# Patient Record
Sex: Male | Born: 1965 | Race: White | Hispanic: No | Marital: Married
Health system: Southern US, Community
[De-identification: ages and names within clinical notes are randomized; demographics above are authoritative.]

## PROBLEM LIST (undated history)

## (undated) DIAGNOSIS — M549 Dorsalgia, unspecified: Secondary | ICD-10-CM

## (undated) DIAGNOSIS — B1920 Unspecified viral hepatitis C without hepatic coma: Secondary | ICD-10-CM

## (undated) DIAGNOSIS — F32A Depression, unspecified: Secondary | ICD-10-CM

## (undated) DIAGNOSIS — K7460 Unspecified cirrhosis of liver: Secondary | ICD-10-CM

## (undated) DIAGNOSIS — F329 Major depressive disorder, single episode, unspecified: Secondary | ICD-10-CM

## (undated) DIAGNOSIS — F1910 Other psychoactive substance abuse, uncomplicated: Secondary | ICD-10-CM

## (undated) DIAGNOSIS — K5792 Diverticulitis of intestine, part unspecified, without perforation or abscess without bleeding: Secondary | ICD-10-CM

---

## 2017-08-27 ENCOUNTER — Emergency Department (HOSPITAL_COMMUNITY): Payer: Self-pay

## 2017-08-27 ENCOUNTER — Encounter (HOSPITAL_COMMUNITY): Payer: Self-pay

## 2017-08-27 ENCOUNTER — Other Ambulatory Visit: Payer: Self-pay

## 2017-08-27 ENCOUNTER — Emergency Department (HOSPITAL_COMMUNITY)
Admission: EM | Admit: 2017-08-27 | Discharge: 2017-08-28 | Disposition: A | Discharge status: Home / Self Care | Payer: Self-pay | Attending: Emergency Medicine | Admitting: Emergency Medicine

## 2017-08-27 DIAGNOSIS — F331 Major depressive disorder, recurrent, moderate: Secondary | ICD-10-CM | POA: Insufficient documentation

## 2017-08-27 DIAGNOSIS — F1994 Other psychoactive substance use, unspecified with psychoactive substance-induced mood disorder: Secondary | ICD-10-CM | POA: Diagnosis present

## 2017-08-27 DIAGNOSIS — R1084 Generalized abdominal pain: Secondary | ICD-10-CM | POA: Insufficient documentation

## 2017-08-27 DIAGNOSIS — R45851 Suicidal ideations: Secondary | ICD-10-CM

## 2017-08-27 DIAGNOSIS — Z79899 Other long term (current) drug therapy: Secondary | ICD-10-CM | POA: Insufficient documentation

## 2017-08-27 DIAGNOSIS — F1410 Cocaine abuse, uncomplicated: Secondary | ICD-10-CM | POA: Insufficient documentation

## 2017-08-27 HISTORY — DX: Unspecified viral hepatitis C without hepatic coma: B19.20

## 2017-08-27 HISTORY — DX: Diverticulitis of intestine, part unspecified, without perforation or abscess without bleeding: K57.92

## 2017-08-27 HISTORY — DX: Depression, unspecified: F32.A

## 2017-08-27 HISTORY — DX: Major depressive disorder, single episode, unspecified: F32.9

## 2017-08-27 HISTORY — DX: Unspecified cirrhosis of liver: K74.60

## 2017-08-27 HISTORY — DX: Dorsalgia, unspecified: M54.9

## 2017-08-27 HISTORY — DX: Other psychoactive substance abuse, uncomplicated: F19.10

## 2017-08-27 LAB — URINALYSIS, ROUTINE W REFLEX MICROSCOPIC
BILIRUBIN URINE: NEGATIVE
Bacteria, UA: NONE SEEN
Glucose, UA: NEGATIVE mg/dL
Hgb urine dipstick: NEGATIVE
Ketones, ur: NEGATIVE mg/dL
Leukocytes, UA: NEGATIVE
Nitrite: NEGATIVE
PROTEIN: 100 mg/dL — AB
SPECIFIC GRAVITY, URINE: 1.026 (ref 1.005–1.030)
pH: 5 (ref 5.0–8.0)

## 2017-08-27 LAB — RAPID URINE DRUG SCREEN, HOSP PERFORMED
Amphetamines: NOT DETECTED
Barbiturates: NOT DETECTED
Benzodiazepines: NOT DETECTED
Cocaine: POSITIVE — AB
OPIATES: NOT DETECTED
TETRAHYDROCANNABINOL: NOT DETECTED

## 2017-08-27 LAB — COMPREHENSIVE METABOLIC PANEL
ALBUMIN: 3.7 g/dL (ref 3.5–5.0)
ALK PHOS: 64 U/L (ref 38–126)
ALT: 16 U/L — AB (ref 17–63)
AST: 15 U/L (ref 15–41)
Anion gap: 11 (ref 5–15)
BUN: 17 mg/dL (ref 6–20)
CALCIUM: 8.8 mg/dL — AB (ref 8.9–10.3)
CO2: 15 mmol/L — AB (ref 22–32)
CREATININE: 1.14 mg/dL (ref 0.61–1.24)
Chloride: 109 mmol/L (ref 101–111)
GFR calc Af Amer: >60 mL/min (ref >60)
GFR calc non Af Amer: >60 mL/min (ref >60)
GLUCOSE: 101 mg/dL — AB (ref 65–99)
Potassium: 3.4 mmol/L — ABNORMAL LOW (ref 3.5–5.1)
SODIUM: 135 mmol/L (ref 135–145)
Total Bilirubin: 0.6 mg/dL (ref 0.3–1.2)
Total Protein: 7.2 g/dL (ref 6.5–8.1)

## 2017-08-27 LAB — CBC WITH DIFFERENTIAL/PLATELET
Basophils Absolute: 0 10*3/uL (ref 0.0–0.1)
Basophils Relative: 0 %
Eosinophils Absolute: 0 10*3/uL (ref 0.0–0.7)
Eosinophils Relative: 0 %
HEMATOCRIT: 41 % (ref 39.0–52.0)
HEMOGLOBIN: 14.2 g/dL (ref 13.0–17.0)
LYMPHS ABS: 1.6 10*3/uL (ref 0.7–4.0)
LYMPHS PCT: 10 %
MCH: 31 pg (ref 26.0–34.0)
MCHC: 34.6 g/dL (ref 30.0–36.0)
MCV: 89.5 fL (ref 78.0–100.0)
Monocytes Absolute: 1.4 10*3/uL — ABNORMAL HIGH (ref 0.1–1.0)
Monocytes Relative: 9 %
NEUTROS PCT: 81 %
Neutro Abs: 13.3 10*3/uL — ABNORMAL HIGH (ref 1.7–7.7)
Platelets: 188 10*3/uL (ref 150–400)
RBC: 4.58 MIL/uL (ref 4.22–5.81)
RDW: 13.2 % (ref 11.5–15.5)
WBC: 16.3 10*3/uL — AB (ref 4.0–10.5)

## 2017-08-27 LAB — LIPID PANEL
Cholesterol: 201 mg/dL — ABNORMAL HIGH (ref 0–200)
HDL: 44 mg/dL (ref >40)
LDL CALC: 124 mg/dL — AB (ref 0–99)
TRIGLYCERIDES: 164 mg/dL — AB (ref <150)
Total CHOL/HDL Ratio: 4.6 RATIO
VLDL: 33 mg/dL (ref 0–40)

## 2017-08-27 LAB — PROTIME-INR
INR: 1.02
Prothrombin Time: 13.3 seconds (ref 11.4–15.2)

## 2017-08-27 LAB — I-STAT TROPONIN, ED: Troponin i, poc: 0.01 ng/mL (ref 0.00–0.08)

## 2017-08-27 LAB — ACETAMINOPHEN LEVEL: Acetaminophen (Tylenol), Serum: <10 ug/mL — ABNORMAL LOW (ref 10–30)

## 2017-08-27 LAB — TROPONIN I: Troponin I: <0.03 ng/mL (ref <0.03)

## 2017-08-27 LAB — APTT: aPTT: 28 seconds (ref 24–36)

## 2017-08-27 LAB — SALICYLATE LEVEL

## 2017-08-27 LAB — ETHANOL

## 2017-08-27 MED ORDER — IOPAMIDOL (ISOVUE-300) INJECTION 61%
INTRAVENOUS | Status: AC
  Administered 2017-08-27: 100 mL
  Filled 2017-08-27: qty 100

## 2017-08-27 MED ORDER — SODIUM CHLORIDE 0.9 % IV SOLN
INTRAVENOUS | Status: DC
  Administered 2017-08-27: 16:00:00 via INTRAVENOUS

## 2017-08-27 MED ORDER — ACETAMINOPHEN 325 MG PO TABS
650.0000 mg | ORAL_TABLET | Freq: Once | ORAL | Status: AC
  Administered 2017-08-27: 650 mg via ORAL
  Filled 2017-08-27: qty 2

## 2017-08-27 MED ORDER — NITROGLYCERIN 0.4 MG SL SUBL
0.4000 mg | SUBLINGUAL_TABLET | SUBLINGUAL | Status: DC | PRN
  Administered 2017-08-27: 0.4 mg via SUBLINGUAL
  Filled 2017-08-27: qty 1

## 2017-08-27 NOTE — ED Provider Notes (Signed)
[facility] EMERGENCY DEPARTMENT Provider Note   CSN: [id number] Arrival date & time: 08/27/17  1507     History   Chief Complaint Chief Complaint  Patient presents with  . Suicidal  . Chest Pain    HPI Jose Jennings is a 52 y.o. male presenting for evaluation of chest pain.  Patient states that his chest pain began approximately 30 minutes ago.  It began after he used cocaine.  He states pain is described as a pressure right in the center of his chest.  He has associated nausea and shortness of breath.  He was given aspirin, nitro, and Zofran in route without improvement of symptoms.  He reports fevers, diarrhea, and cramping abdominal pain which began on Friday.  Has history of diverticulitis requiring surgery 2 years ago.  He reports nonproductive cough.  Denies abnormal urination.  No leg pain or swelling.  Smokes daily.  Denies alcohol or other drug use.  Family history of ACS.  No personal history of ACS.  History of high blood pressure for which she does not take medication, no history of diabetes or hyperlipidemia. Patient states he has been having suicidal thoughts.  He used cocaine with the intent to kill himself.  He is currently having suicidal thoughts without a plan.  No homicidal thoughts.  He states he is hearing and seeing dark shapes.  HPI  History reviewed. No pertinent past medical history.  There are no active problems to display for this patient.   History reviewed. No pertinent surgical history.      Home Medications    Prior to Admission medications   Medication Sig Start Date End Date Taking? Authorizing Provider  acetaminophen (TYLENOL) 500 MG tablet Take 500 mg by mouth every 6 (six) hours as needed for headache (pain).   Yes [provider]  DULoxetine (CYMBALTA) 60 MG capsule Take 60 mg by mouth daily.   Yes [provider]  QUEtiapine (SEROQUEL) 200 MG tablet Take 200 mg by mouth at bedtime.   Yes [provider]    Family History No family history on file.  Social History Social History   Tobacco Use  . Smoking status: Not on file  Substance Use Topics  . Alcohol use: Not on file  . Drug use: Not on file     Allergies   Patient has no known allergies.   Review of Systems Review of Systems  Respiratory: Positive for cough, chest tightness and shortness of breath.   Cardiovascular: Positive for chest pain.  Gastrointestinal: Positive for abdominal pain, diarrhea and nausea. Negative for blood in stool and vomiting.  Psychiatric/Behavioral: Positive for dysphoric mood, hallucinations, self-injury and suicidal ideas.  All other systems reviewed and are negative.    Physical Exam Updated Vital Signs BP (!) 116/59   Pulse 89   Temp 98.7 F (37.1 C) (Oral)   Resp (!) 24   SpO2 100%   Physical Exam  Constitutional: He is oriented to person, place, and time. He appears well-developed and well-nourished.  Pt appears uncomfortable due to pain.   HENT:  Head: Normocephalic and atraumatic.  Eyes: Pupils are equal, round, and reactive to light.  Neck: Normal range of motion. Neck supple.  Cardiovascular: Normal rate, regular rhythm and intact distal pulses.  Pulmonary/Chest: Effort normal and breath sounds normal. No respiratory distress. He has no wheezes.  Speaking in full sentences. Clear lung sounds  Abdominal: Soft. He exhibits no distension and no mass. There is  tenderness. There is no guarding.  Generalized TTP of the abd, worse in LLQ. No rigidity, guarding, or distention.  Musculoskeletal: Normal range of motion.  No leg pain or swelling. Radial and pedal pulses intact bilaterally.   Neurological: He is alert and oriented to person, place, and time. No sensory deficit.  Skin: Skin is warm and dry.  Psychiatric: He is hyperactive and actively hallucinating. He expresses suicidal ideation. He expresses no homicidal ideation. He expresses suicidal plans. He  expresses no homicidal plans.  Active responding to internal stimuli. Use of cocaine in attempt to harm himself. reports current SI. Behavior mildly hyperactive  Nursing note and vitals reviewed.    ED Treatments / Results  Labs (all labs ordered are listed, but only abnormal results are displayed) Labs Reviewed  CBC WITH DIFFERENTIAL/PLATELET - Abnormal; Notable for the following components:      Result Value   WBC 16.3 (*)    Neutro Abs 13.3 (*)    Monocytes Absolute 1.4 (*)    All other components within normal limits  COMPREHENSIVE METABOLIC PANEL - Abnormal; Notable for the following components:   Potassium 3.4 (*)    CO2 15 (*)    Glucose, Bld 101 (*)    Calcium 8.8 (*)    ALT 16 (*)    All other components within normal limits  LIPID PANEL - Abnormal; Notable for the following components:   Cholesterol 201 (*)    Triglycerides 164 (*)    LDL Cholesterol 124 (*)    All other components within normal limits  URINALYSIS, ROUTINE W REFLEX MICROSCOPIC - Abnormal; Notable for the following components:   APPearance HAZY (*)    Protein, ur 100 (*)    Squamous Epithelial / LPF 0-5 (*)    All other components within normal limits  RAPID URINE DRUG SCREEN, HOSP PERFORMED - Abnormal; Notable for the following components:   Cocaine POSITIVE (*)    All other components within normal limits  ACETAMINOPHEN LEVEL - Abnormal; Notable for the following components:   Acetaminophen (Tylenol), Serum <10 (*)    All other components within normal limits  CULTURE, BLOOD (ROUTINE X 2)  CULTURE, BLOOD (ROUTINE X 2)  PROTIME-INR  APTT  TROPONIN I  SALICYLATE LEVEL  ETHANOL  I-STAT TROPONIN, ED    EKG EKG Interpretation  Date/Time:  Saturday August 27 2017 15:15:37 EDT Ventricular Rate:  89 PR Interval:    QRS Duration: 96 QT Interval:  363 QTC Calculation: 442 R Axis:   67 Text Interpretation:  Sinus rhythm Probable left atrial enlargement No old tracing to compare Confirmed by  Mancel BaleWentz, Elliott [phone]) on 08/27/2017 4:32:00 PM   Radiology Dg Chest 2 View  Result Date: 08/27/2017 CLINICAL DATA:  LEFT-sided chest pain, dizziness and weakness onset today. EXAM: CHEST - 2 VIEW COMPARISON:  None. FINDINGS: Heart size and mediastinal contours are within normal limits. Lungs are clear. No pleural effusion or pneumothorax seen. No acute or suspicious osseous finding. Fusion hardware within the lower cervical spine is incompletely imaged. IMPRESSION: No active cardiopulmonary disease. No evidence of pneumonia or pulmonary edema. Electronically Signed   By: Bary RichardStan  Maynard M.D.   On: 08/27/2017 15:58   Ct Abdomen Pelvis W Contrast  Result Date: 08/27/2017 CLINICAL DATA:  Acute generalized abdominal pain with fever. No nausea or vomiting. EXAM: CT ABDOMEN AND PELVIS WITH CONTRAST TECHNIQUE: Multidetector CT imaging of the abdomen and pelvis was performed using the standard protocol following bolus administration of intravenous contrast. CONTRAST:  ISOVUE-300 IOPAMIDOL (ISOVUE-300) INJECTION 61% COMPARISON:  None. FINDINGS: Lower chest: No acute abnormality. Hepatobiliary: No focal liver abnormality is seen. Status post cholecystectomy. No biliary dilatation. Pancreas: Unremarkable. No pancreatic ductal dilatation or surrounding inflammatory changes. Spleen: Normal in size without focal abnormality. Adrenals/Urinary Tract: Adrenal glands are unremarkable. Kidneys are normal, without renal calculi, focal lesion, or hydronephrosis. Bladder is unremarkable. Stomach/Bowel: Tiny hiatal hernia. Physiologic distention of the stomach with normal small bowel rotation. Mild thickening of the distal and terminal ileum suspicious for an ileitis. Suture lines at the base of the cecum compatible with prior appendectomy and at the junction of the rectum and sigmoid from what appears to be partial left colectomy. Vascular/Lymphatic: Mild aortoiliac atherosclerosis without aneurysm. No abdominopelvic  adenopathy. Reproductive: Normal seminal vesicles and prostate. Other: No free air or free fluid. Musculoskeletal: Changes of mild left sacroiliitis or osteoarthritis with subcortical cystic change and minimal spurring noted. Small cartilaginous rest of the right iliac bone suspected. No acute osseous abnormality. Small bone islands of L1 as well as left femoral head and neck. IMPRESSION: 1. Acute mild terminal ileitis without bowel obstruction, perforation or abscess. 2. 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 3. Intact anastomotic suture line at the rectosigmoid juncture. Staple line at the base of the cecum also compatible with appendectomy. Electronically Signed   By: Tollie Eth M.D.   On: 08/27/2017 19:02    Procedures Procedures (including critical care time)  Medications Ordered in ED Medications  0.9 %  sodium chloride infusion ( Intravenous Stopped 08/27/17 2242)  acetaminophen (TYLENOL) tablet 650 mg (has no administration in time range)  acetaminophen (TYLENOL) tablet 650 mg (650 mg Oral Given 08/27/17 1609)  iopamidol (ISOVUE-300) 61 % injection (100 mLs  Contrast Given 08/27/17 1644)     Initial Impression / Assessment and Plan / ED Course  I have reviewed the triage vital signs and the nursing notes.  Pertinent labs & imaging results that were available during my care of the patient were reviewed by me and considered in my medical decision making (see chart for details).     Pt presenting for evaluation of chest pain after using cocaine.  Also presenting for attempt to kill himself by using cocaine.  Patient appears uncomfortable due to pain, vitals stable.  Obviously responding to internal stimuli. Will obtain labs, EKG, and urine for further evaluation.  Troponin negative and EKG without signs of STEMI.  CXR negative for infection. Doubt ACS.  I expect CP due to cocaine use. On reassessment, patient reports pain in his chest is improved, but abd pain still  present.  Leukocytosis of 16.3.  Will obtain CT abdomen due to history of diverticulitis and ?intussusception last year.   CT shows ileitis without acute findings.  I expect this will resolve without intervention, he is afebrile in the ER.  I do not think he needs antibiotics at this time.  Discussed findings with patient.  At this time, patient is medically cleared for TTS eval.  Pt signed out to [facility], PA-C for f/u on TTS dispo recommendations.    Final Clinical Impressions(s) / ED Diagnoses   Final diagnoses:  None    ED Discharge Orders    None       Alveria Apley, PA-C 08/27/17 2252    Mancel Bale, MD 08/27/17 [phone]

## 2017-08-27 NOTE — ED Notes (Signed)
Ordered dinner for pt.

## 2017-08-27 NOTE — BH Assessment (Addendum)
Tele Assessment Note   Patient Name: Jose Jennings MRN: [id number] Referring Physician: Alveria Apley, PA-C Location of Patient: [facility] Location of Provider: Behavioral Health TTS Department  Jose Jennings is an 52 y.o. male who presents voluntarily to [facility] EMS reporting symptoms of depression and suicidal ideation. Pt has a history of depression and cocaine abuse. Pt reports medications of Cymbalta 60mg  and Seroquel.  Pt denies current suicidal ideation and denies having a plan. Pt denies past attempts, however he does report a history of cutting. Pt acknowledges symptoms including: sadness and guilt.  Pt denies homicidal ideation/ history of violence. Pt denies auditory or visual hallucinations or other psychotic symptoms. Pt states current stressors include life stresses, financial stress and not seeing his children.   Pt lives with is wife and supports include his wife. Pt denies history of abuse and trauma. Pt reports there is a family history of SI/SA. Pt currently works in Aeronautical engineer. Pt has fair insight and partial judgment. Pt's memory is intact.  Legal history includes having to go to child support court on Oct 12, 2017.  Pt's OP history includes currently receiving therapy and medication management at the [facility]. Pt reports IP hospitalization 5 years ago.  Pt denies alcohol and reports substance abuse relapse on cocaine.  Pt is dressed in scrubs, alert, oriented x4 with normal speech and normal motor behavior. Eye contact is good. Pt's mood is depressed and sad and affect is depressed and sad. Affect is congruent with mood. Thought process is coherent and relevant. There is no indication pt is currently responding to internal stimuli or experiencing delusional thought content. Pt was cooperative throughout assessment. Pt is currently able to contract for safety outside the hospital.  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 No pertinent past medical history.  Family History: No family history on file.  Social History:  has no tobacco, alcohol, and drug history on file.  Additional Social History:  Alcohol / Drug Use Pain Medications: See MAR Prescriptions: See MAR Over the Counter: See MAR History of alcohol / drug use?: Yes Substance #1 Name of Substance 1: Cocaine 1 - Age of First Use: 26 1 - Amount (size/oz): Unknown 1 - Frequency: Recovery 1 - Duration: Relapsed on 08/26/17 1 - Last Use / Amount: 08/26/17, unknown amount  CIWA: CIWA-Ar BP: (!) 116/59 Pulse Rate: 89 COWS:    Allergies: No Known Allergies  Home Medications:  (Not in a hospital admission)  OB/GYN Status:  No LMP for male patient.  General Assessment Data Location of Assessment: [facility] ED TTS Assessment: In system Is this a Tele or Face-to-Face Assessment?: Tele Assessment Is this an Initial Assessment or a Re-assessment for this encounter?: Initial Assessment Marital status: Married [place] name: NA Is patient pregnant?: No Pregnancy Status: No Living Arrangements: Spouse/significant other Can pt return to current living arrangement?: Yes Admission Status: Voluntary Is patient capable of signing voluntary admission?: Yes Referral Source: Self/Family/Friend Insurance type: Self pay     Crisis Care Plan Living Arrangements: Spouse/significant other Name of Psychiatrist: [facility] Name of Therapist: [facility]  Education Status Is patient currently in school?: No Is the patient employed, unemployed or receiving disability?: Employed  Risk to self with the past 6 months Suicidal Ideation: No-Not Currently/Within Last 6 Months Has patient been a risk to self within the past 6 months prior to admission? :  No Suicidal Intent: No-Not Currently/Within Last 6 Months Has patient had any suicidal intent within the past 6 months prior to admission? :  Yes Is patient at risk for suicide?: Yes Suicidal Plan?: Yes-Currently Present Has patient had any suicidal plan within the past 6 months prior to admission? : Yes Specify Current Suicidal Plan: Pt planned to overdose on cocaine Access to Means: Yes Specify Access to Suicidal Means: Pt has access to cocaine What has been your use of drugs/alcohol within the last 12 months?: Pt states he used cocaine Friday for the 1st time in 5 years Previous Attempts/Gestures: No Other Self Harm Risks: 0 Triggers for Past Attempts: (NA) Intentional Self Injurious Behavior: Cutting Comment - Self Injurious Behavior: Pt states he cut his wrists in 2007 Family Suicide History: Yes(Pt states his sister attempted and he great uncle committed) Recent stressful life event(s): Financial Problems, Turmoil (Comment)(Pt states life stress and not seeing his children) Persecutory voices/beliefs?: No Depression: No Depression Symptoms: Despondent, Guilt Substance abuse history and/or treatment for substance abuse?: No Suicide prevention information given to non-admitted patients: Not applicable  Risk to Others within the past 6 months Homicidal Ideation: No Does patient have any lifetime risk of violence toward others beyond the six months prior to admission? : No Thoughts of Harm to Others: No Current Homicidal Intent: No Current Homicidal Plan: No Access to Homicidal Means: No Identified Victim: Pt denies History of harm to others?: No Assessment of Violence: None Noted Violent Behavior Description: Pt denies Does patient have access to weapons?: No Criminal Charges Pending?: No Does patient have a court date: Yes Court Date: 09/12/17(Child support) Is patient on probation?: No  Psychosis Hallucinations: None noted Delusions: None noted  Mental Status Report Appearance/Hygiene: In scrubs Eye Contact: Good Motor Activity: Freedom of movement Speech: Logical/coherent Level of Consciousness:  Alert Mood: Sad, Depressed Affect: Sad, Depressed Anxiety Level: None Thought Processes: Coherent, Relevant Judgement: Partial Orientation: Person, Place, Time, Situation, Appropriate for developmental age Obsessive Compulsive Thoughts/Behaviors: None  Cognitive Functioning Concentration: Normal Memory: Recent Intact, Remote Intact Is patient IDD: No Is patient DD?: No Insight: Fair Impulse Control: Fair Appetite: Good Have you had any weight changes? : No Change Sleep: No Change Total Hours of Sleep: 8 Vegetative Symptoms: None  ADLScreening [facility] Assessment Services) Patient's cognitive ability adequate to safely complete daily activities?: Yes Patient able to express need for assistance with ADLs?: Yes Independently performs ADLs?: Yes (appropriate for developmental age)  Prior Inpatient Therapy Prior Inpatient Therapy: Yes Prior Therapy Dates: 5 years ago Prior Therapy Facilty/Provider(s): Pt doesn't remember Reason for Treatment: Depression, SI  Prior Outpatient Therapy Prior Outpatient Therapy: Yes Prior Therapy Dates: Current Prior Therapy Facilty/Provider(s): [facility] Reason for Treatment: Depression Does patient have an ACCT team?: No Does patient have Intensive In-House Services?  : No Does patient have [facility] services? : No Does patient have P4CC services?: No  ADL Screening (condition at time of admission) Patient's cognitive ability adequate to safely complete daily activities?: Yes Is the patient deaf or have difficulty hearing?: No Does the patient have difficulty seeing, even when wearing glasses/contacts?: No Does the patient have difficulty concentrating, remembering, or making decisions?: No Patient able to express need for assistance with ADLs?: Yes Does the patient have difficulty dressing or bathing?: No Independently performs ADLs?: Yes (appropriate for developmental age) Does the patient have difficulty walking or climbing stairs?:  No Weakness of Legs: None Weakness of Arms/Hands: None  Home Assistive Devices/Equipment Home Assistive Devices/Equipment: None  Abuse/Neglect Assessment (Assessment to be complete while patient is alone) Abuse/Neglect Assessment Can Be Completed: Yes Physical Abuse: Denies Verbal Abuse: Denies Sexual Abuse: Denies Exploitation of patient/patient's resources: Denies Self-Neglect: Denies     Advance Directives (For Healthcare) Does Patient Have a Medical Advance Directive?: No Would patient like information on creating a medical advance directive?: No - Patient declined          Disposition: Gave clinical report to Nira Conn, NP who recommends overnight observation for safety and stabilization.  Notified Monique, RN and Herminie, [place] of recommendation and she will notify Perpetue, Charity fundraiser. Disposition Initial Assessment Completed for this Encounter: Yes Patient referred to: Other (Comment)(Overnight observation)  This service was provided via telemedicine using a 2-way, interactive audio and video technology.  Names of all persons participating in this telemedicine service and their role in this encounter. Name: Jose Jennings Role: Patient  Name: Annamaria Boots, MS, [facility] Role: TTS Counselor  Name:  Role:   Name:  Role:    Annamaria Boots, MS, [employer] [facility] Therapeutic Triage Specialist  Annamaria Boots 08/27/2017 10:34 PM

## 2017-08-27 NOTE — ED Triage Notes (Signed)
Pt arrives via EMS with complaints of chest pain after cocaine use. Pt reports coming to [place] from home for cocaine due to suicidal thoughts. Pt endorses central chest pain with nausea.

## 2017-08-28 ENCOUNTER — Encounter (HOSPITAL_COMMUNITY): Payer: Self-pay

## 2017-08-28 DIAGNOSIS — F1994 Other psychoactive substance use, unspecified with psychoactive substance-induced mood disorder: Secondary | ICD-10-CM | POA: Diagnosis present

## 2017-08-28 NOTE — Consult Note (Addendum)
Telepsych Consultation   Reason for Consult:  Suicidal Ideations  Referring Physician:  EPD  Location of Patient: F07C Location of Provider: [facility]  Patient Identification: Shabazz Mckey MRN:  [id number] Principal Diagnosis: <principal problem not specified> Diagnosis:   Patient Active Problem List   Diagnosis Date Noted  . Substance induced mood disorder ([place]) [F19.94] 08/28/2017    Total Time spent with patient: 30 minutes  Subjective:   Jams Strand is a 52 y.o. male reassessed via teleassessment.  Patient is awake alert and oriented x3.  Reports feeling suicidal after relapsing on cocaine.  Reports he has been clean for the past 5 years and this is his first setback.  Patient reports he is very remorseful for his statements.  Patient denies that he is homicidal or suicidal during this assessment.  Reports that he spoken to his wife and is able to come home.  Patient provided verbal permission to speak to his wife. patient reports he has his wife's cell phone in his car (outside of the hospital)  so he is unable to contact her at this time.  Reports she called him from the neighbors house.  Patient reports he is followed by [facility] and has an appointment April 22nd.  Reports he is taking medications as prescribed and tolerating them well. CSW/ TTS to obtain collateral-pending disposition. of note patient was evaluated by [facility] 2018 for similar statement. ( will disposition after collateral information)   10:50 am: NP Spoke to [place] ([phone] who validates husband's story.  Reports she feels safe with patient discharging home.  Reports patient has "relapsed" multiple times in the past.  Denies that patient has access to guns or weapons.  Wife does confirm patient has a follow-up appointment with the [place].  Substance abuse resources will be faxed over.  Support and encouragement and reassurance was provided.     HPI:  Per assessment note: Lecil Souter is an  52 y.o. male who presents voluntarily to [facility] reporting symptoms of depression and suicidal ideation. Pt has a history of depression and cocaine abuse. Pt reports medications of Cymbalta 49m and Seroquel.  Pt denies current suicidal ideation and denies having a plan. Pt denies past attempts, however he does report a history of cutting. Pt acknowledges symptoms including: sadness and guilt.  Pt denies homicidal ideation/ history of violence. Pt denies auditory or visual hallucinations or other psychotic symptoms. Pt states current stressors include life stresses, financial stress and not seeing his children.   Pt lives with is wife and supports include his wife. Pt denies history of abuse and trauma. Pt reports there is a family history of SI/SA. 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 Pt's memory is intact.  Legal history includes having to go to child support court on Oct 12, 2017.  Pt's OP history includes currently receiving therapy and medication management at the [facility] Pt reports IP hospitalization 5 years ago.  Pt denies alcohol and reports substance abuse relapse on cocaine.  Pt is dressed in scrubs, alert, oriented x4 with normal speech and normal motor behavior. Eye contact is good. Pt's mood is depressed and sad and affect is depressed and sad. Affect is congruent with mood. Thought process is coherent and relevant. There is no indication pt is currently responding to internal stimuli or experiencing delusional thought content. Pt was cooperative throughout assessment. Pt is currently able to contract for safety outside the hospital.  Past Psychiatric History:  See Chart   Risk to Self: Suicidal Ideation: No-Not Currently/Within Last 6 Months Suicidal Intent: No-Not Currently/Within Last 6 Months Is patient at risk for suicide?: Yes Suicidal Plan?: Yes-Currently Present Specify Current Suicidal Plan: Pt planned to overdose on  cocaine Access to Means: Yes Specify Access to Suicidal Means: Pt has access to cocaine What has been your use of drugs/alcohol within the last 12 months?: Pt states he used cocaine Friday for the 1st time in 5 years Other Self Harm Risks: 0 Triggers for Past Attempts: (NA) Intentional Self Injurious Behavior: Cutting Comment - Self Injurious Behavior: Pt states he cut his wrists in 2007 Risk to Others: Homicidal Ideation: No Thoughts of Harm to Others: No Current Homicidal Intent: No Current Homicidal Plan: No Access to Homicidal Means: No Identified Victim: Pt denies History of harm to others?: No Assessment of Violence: None Noted Violent Behavior Description: Pt denies Does patient have access to weapons?: No Criminal Charges Pending?: No Does patient have a court date: Yes Court Date: 09/12/17(Child support) Prior Inpatient Therapy: Prior Inpatient Therapy: Yes Prior Therapy Dates: 5 years ago Prior Therapy Facilty/Provider(s): Pt doesn't remember Reason for Treatment: Depression, SI Prior Outpatient Therapy: Prior Outpatient Therapy: Yes Prior Therapy Dates: Current Prior Therapy Facilty/Provider(s): [facility] Reason for Treatment: Depression Does patient have an ACCT team?: No Does patient have Intensive In-House Services?  : No Does patient have [facility] services? : No Does patient have P4CC services?: No  Past Medical History:  Past Medical History:  Diagnosis Date  . Back pain   . Cirrhosis ([place])   . Depression   . Diverticulitis   . Hepatitis C   . Substance abuse ([place])    History reviewed. No pertinent surgical history. Family History: No family history on file. Family Psychiatric  History:  Social History:  Social History   Substance and Sexual Activity  Alcohol Use Not on file     Social History   Substance and Sexual Activity  Drug Use Not on file    Social History   Socioeconomic History  . Marital status: Married    Spouse name: Not on  file  . Number of children: Not on file  . Years of education: Not on file  . Highest education level: Not on file  Occupational History  . Not on file  Social Needs  . Financial resource strain: Not on file  . Food insecurity:    Worry: Not on file    Inability: Not on file  . Transportation needs:    Medical: Not on file    Non-medical: Not on file  Tobacco Use  . Smoking status: Not on file  Substance and Sexual Activity  . Alcohol use: Not on file  . Drug use: Not on file  . Sexual activity: Not on file  Lifestyle  . Physical activity:    Days per week: Not on file    Minutes per session: Not on file  . Stress: Not on file  Relationships  . Social connections:    Talks on phone: Not on file    Gets together: Not on file    Attends religious service: Not on file    Active member of club or organization: Not on file    Attends meetings of clubs or organizations: Not on file    Relationship status: Not on file  Other Topics Concern  . Not on file  Social History Narrative  . Not on file  Additional Social History:    Allergies:  No Known Allergies  Labs:  Results for orders placed or performed during the hospital encounter of 08/27/17 (from the past 48 hour(s))  Urinalysis, Routine w reflex microscopic     Status: Abnormal   Collection Time: 08/27/17  3:23 PM  Result Value Ref Range   Color, Urine YELLOW YELLOW   APPearance HAZY (A) CLEAR   Specific Gravity, Urine 1.026 1.005 - 1.030   pH 5.0 5.0 - 8.0   Glucose, UA NEGATIVE NEGATIVE mg/dL   Hgb urine dipstick NEGATIVE NEGATIVE   Bilirubin Urine NEGATIVE NEGATIVE   Ketones, ur NEGATIVE NEGATIVE mg/dL   Protein, ur 100 (A) NEGATIVE mg/dL   Nitrite NEGATIVE NEGATIVE   Leukocytes, UA NEGATIVE NEGATIVE   RBC / HPF 0-5 0 - 5 RBC/hpf   WBC, UA 6-30 0 - 5 WBC/hpf   Bacteria, UA NONE SEEN NONE SEEN   Squamous Epithelial / LPF 0-5 (A) NONE SEEN   WBC Clumps PRESENT    Mucus PRESENT    Hyaline Casts, UA  PRESENT     Comment: Performed at [facility], [place] [address] [place], [place] [address]  Rapid urine drug screen (hospital performed)     Status: Abnormal   Collection Time: 08/27/17  3:23 PM  Result Value Ref Range   Opiates NONE DETECTED NONE DETECTED   Cocaine POSITIVE (A) NONE DETECTED   Benzodiazepines NONE DETECTED NONE DETECTED   Amphetamines NONE DETECTED NONE DETECTED   Tetrahydrocannabinol NONE DETECTED NONE DETECTED   Barbiturates NONE DETECTED NONE DETECTED    Comment: (NOTE) DRUG SCREEN FOR MEDICAL PURPOSES ONLY.  IF CONFIRMATION IS NEEDED FOR ANY PURPOSE, NOTIFY LAB WITHIN 5 DAYS. LOWEST DETECTABLE LIMITS FOR URINE DRUG SCREEN Drug Class                     Cutoff (ng/mL) Amphetamine and metabolites    1000 Barbiturate and metabolites    200 Benzodiazepine                 741 Tricyclics and metabolites     300 Opiates and metabolites        300 Cocaine and metabolites        300 THC                            50 Performed at [facility], [place] [address], [place], [place] [address]   CBC with Differential/Platelet     Status: Abnormal   Collection Time: 08/27/17  3:32 PM  Result Value Ref Range   WBC 16.3 (H) 4.0 - 10.5 K/uL   RBC 4.58 4.22 - 5.81 MIL/uL   Hemoglobin 14.2 13.0 - 17.0 g/dL   HCT 41.0 39.0 - 52.0 %   MCV 89.5 78.0 - 100.0 fL   MCH 31.0 26.0 - 34.0 pg   MCHC 34.6 30.0 - 36.0 g/dL   RDW 13.2 11.5 - 15.5 %   Platelets 188 150 - 400 K/uL   Neutrophils Relative % 81 %   Neutro Abs 13.3 (H) 1.7 - 7.7 K/uL   Lymphocytes Relative 10 %   Lymphs Abs 1.6 0.7 - 4.0 K/uL   Monocytes Relative 9 %   Monocytes Absolute 1.4 (H) 0.1 - 1.0 K/uL   Eosinophils Relative 0 %   Eosinophils Absolute 0.0 0.0 - 0.7 K/uL   Basophils Relative 0 %   Basophils Absolute  0.0 0.0 - 0.1 K/uL    Comment: Performed at [facility], [place] [address], [place], [place] [address]  Protime-INR     Status: None   Collection Time: 08/27/17  3:32 PM  Result  Value Ref Range   Prothrombin Time 13.3 11.4 - 15.2 seconds   INR 1.02     Comment: Performed at [facility] [address] [place], [place] [address]  APTT     Status: None   Collection Time: 08/27/17  3:32 PM  Result Value Ref Range   aPTT 28 24 - 36 seconds    Comment: Performed at [facility] [address] [place], [place] [address]  Comprehensive metabolic panel     Status: Abnormal   Collection Time: 08/27/17  3:32 PM  Result Value Ref Range   Sodium 135 135 - 145 mmol/L   Potassium 3.4 (L) 3.5 - 5.1 mmol/L   Chloride 109 101 - 111 mmol/L   CO2 15 (L) 22 - 32 mmol/L   Glucose, Bld 101 (H) 65 - 99 mg/dL   BUN 17 6 - 20 mg/dL   Creatinine, Ser 1.14 0.61 - 1.24 mg/dL   Calcium 8.8 (L) 8.9 - 10.3 mg/dL   Total Protein 7.2 6.5 - 8.1 g/dL   Albumin 3.7 3.5 - 5.0 g/dL   AST 15 15 - 41 U/L   ALT 16 (L) 17 - 63 U/L   Alkaline Phosphatase 64 38 - 126 U/L   Total Bilirubin 0.6 0.3 - 1.2 mg/dL   GFR calc non Af Amer >60 >60 mL/min   GFR calc Af Amer >60 >60 mL/min    Comment: (NOTE) The eGFR has been calculated using the CKD EPI equation. This calculation has not been validated in all clinical situations. eGFR's persistently <60 mL/min signify possible Chronic Kidney Disease.    Anion gap 11 5 - 15    Comment: Performed at [facility] [address] [place], [place] [address]  Troponin I     Status: None   Collection Time: 08/27/17  3:32 PM  Result Value Ref Range   Troponin I <0.03 <0.03 ng/mL    Comment: Performed at [facility] [address], [place], [place] [address]  Lipid panel     Status: Abnormal   Collection Time: 08/27/17  3:32 PM  Result Value Ref Range   Cholesterol 201 (H) 0 - 200 mg/dL   Triglycerides 164 (H) <150 mg/dL   HDL 44 >40 mg/dL   Total CHOL/HDL Ratio 4.6 RATIO   VLDL 33 0 - 40 mg/dL   LDL Cholesterol 124 (H) 0 - 99 mg/dL    Comment:        Total Cholesterol/HDL:CHD Risk Coronary Heart Disease Risk Table                      Men   Women  1/2 Average Risk   3.4   3.3  Average Risk       5.0   4.4  2 X Average Risk   9.6   7.1  3 X Average Risk  23.4   11.0        Use the calculated Patient Ratio above and the CHD Risk Table to determine the patient's CHD Risk.        ATP III CLASSIFICATION (LDL):  <100     mg/dL   Optimal  100-129  mg/dL   Near  or Above                    Optimal  130-159  mg/dL   Borderline  160-189  mg/dL   High  >190     mg/dL   Very High Performed at [facility] [address] [place], [place] [address]   Acetaminophen level     Status: Abnormal   Collection Time: 08/27/17  3:32 PM  Result Value Ref Range   Acetaminophen (Tylenol), Serum <10 (L) 10 - 30 ug/mL    Comment:        THERAPEUTIC CONCENTRATIONS VARY SIGNIFICANTLY. A RANGE OF 10-30 ug/mL MAY BE AN EFFECTIVE CONCENTRATION FOR MANY PATIENTS. HOWEVER, SOME ARE BEST TREATED AT CONCENTRATIONS OUTSIDE THIS RANGE. ACETAMINOPHEN CONCENTRATIONS >150 ug/mL AT 4 HOURS AFTER INGESTION AND >50 ug/mL AT 12 HOURS AFTER INGESTION ARE OFTEN ASSOCIATED WITH TOXIC REACTIONS. Performed at [facility], [place] [address], [place], [place] [address]   Salicylate level     Status: None   Collection Time: 08/27/17  3:32 PM  Result Value Ref Range   Salicylate Lvl <1.8 2.8 - 30.0 mg/dL    Comment: Performed at [facility] [address], [place], [place] [address]  Ethanol     Status: None   Collection Time: 08/27/17  3:32 PM  Result Value Ref Range   Alcohol, Ethyl (B) <10 <10 mg/dL    Comment:        LOWEST DETECTABLE LIMIT FOR SERUM ALCOHOL IS 10 mg/dL FOR MEDICAL PURPOSES ONLY Performed at [facility], [place] [address], [place], [place] [address]   I-Stat Troponin, ED     Status: None   Collection Time: 08/27/17  3:38 PM  Result Value Ref Range   Troponin i, poc 0.01 0.00 - 0.08 ng/mL   Comment 3            Comment: Due to the release kinetics of cTnI, a negative result within the  first hours of the onset of symptoms does not rule out myocardial infarction with certainty. If myocardial infarction is still suspected, repeat the test at appropriate intervals.     Medications:  No current facility-administered medications for this encounter.    Current Outpatient Medications  Medication Sig Dispense Refill  . acetaminophen (TYLENOL) 500 MG tablet Take 500 mg by mouth every 6 (six) hours as needed for headache (pain).    . DULoxetine (CYMBALTA) 60 MG capsule Take 60 mg by mouth daily.    . QUEtiapine (SEROQUEL) 200 MG tablet Take 200 mg by mouth at bedtime.      Musculoskeletal:  tele assessment   Psychiatric Specialty Exam: Physical Exam  Constitutional: He is oriented to person, place, and time. He appears well-developed.  Neurological: He is alert and oriented to person, place, and time.  Psychiatric: He has a normal mood and affect. His behavior is normal.    Review of Systems  Psychiatric/Behavioral: Positive for depression and substance abuse. Negative for hallucinations and suicidal ideas. The patient is nervous/anxious.     Blood pressure 135/66, pulse 88, temperature 99.5 F (37.5 C), temperature source Oral, resp. rate 20, SpO2 95 %.There is no height or weight on file to calculate BMI.  General Appearance: Casual  Eye Contact:  Fair  Speech:  Clear and Coherent  Volume:  Normal  Mood:  Anxious  Affect:  Congruent  Thought Process:  Coherent  Orientation:  Full (Time, Place, and Person)  Thought Content:  Hallucinations: None  Suicidal Thoughts:  No retracting statements at this assessment   Homicidal Thoughts:  No  Memory:  Immediate;   Fair Recent;   Fair  Judgement:  Fair  Insight:  Present  Psychomotor Activity:  Normal  Concentration:  Concentration: Fair  Recall:  [employer] of Knowledge:  Fair  Language:  Fair  Akathisia:  No  Handed:  Right  AIMS (if indicated):     Assets:  Communication Skills Desire for  Improvement Resilience Social Support  ADL's:  Intact  Cognition:  WNL  Sleep:      Recommendation: NP consulted with MD Dwyane Dee for discharge disposition.  Additional resources for substance abuse was faxed over. -EDP MD Vanita Panda made aware of current disposition   No evidence of imminent risk to self or others at present.   Patient does not meet criteria for psychiatric inpatient admission. Supportive therapy provided about ongoing stressors. Refer to IOP. Discussed crisis plan, support from social network, calling 911, coming to the Emergency Department, and calling Suicide Hotline. This service was provided via telemedicine using a 2-way, interactive audio and video technology.  Names of all persons participating in this telemedicine service and their role in this encounter. Name: Bean Sjemwell Role: patient   Name: T.Roselene Gray Role: N.P   Derrill [place], NP 08/28/2017 10:50 AM

## 2017-08-28 NOTE — ED Notes (Signed)
Telepsych being performed. 

## 2017-08-28 NOTE — ED Notes (Signed)
Pt c/o some mild lower abd pain. Ask MD and RN for pain medication before leaving other pod and was told md would place order. Current order for tylenol. Will give for pain. Pt pleasant. "I made a mistake, but I'm not suicidal now, I promise". "I just want to go home tomorrow." Sitter at bedside.

## 2017-08-28 NOTE — ED Provider Notes (Signed)
Patient sleeping.  He awakens easily. I discussed his case with our behavioral health team, and psychiatry has evaluated him, deemed him appropriate for discharge with close outpatient follow-up. Patient is understanding of these instructions.   Gerhard Munch, MD 08/28/17 [phone]

## 2017-08-28 NOTE — ED Notes (Signed)
Pt advised his neighbor went to his house but was unable to locate spouse. Pt stated she may have gone to church. Neighbor advised him will try again later. Pt ate breakfast and snack. Lying on bed w/eyes closed. Respirations even, unlabored.

## 2017-08-28 NOTE — ED Notes (Signed)
Pt sleeping soundly - NAD.

## 2017-08-28 NOTE — ED Notes (Signed)
Cell phone given to pt so may call his neighbor who may contact his spouse so NP, [facility], can speak w/her. States he has his spouse's cell phone in his vehicle so is unable to reach her. Pt attempted to call neighbor - no answer. Phone placed at nurses' desk and is being charged for pt while waiting on neighbor to return call.

## 2017-08-28 NOTE — ED Notes (Addendum)
Offered for pt to shower - declined. [place] from [name] to a [employer] in [name] - states "then the police picked me up from there".

## 2017-08-28 NOTE — ED Notes (Signed)
Breakfast tray ordered 

## 2017-09-01 LAB — CULTURE, BLOOD (ROUTINE X 2)
CULTURE: NO GROWTH
Culture: NO GROWTH
SPECIAL REQUESTS: ADEQUATE
Special Requests: ADEQUATE

## 2019-02-05 IMAGING — DX DG CHEST 2V
2 series · 2 of 2 positions shown · non-contrast
Comparison: None.

CLINICAL DATA: LEFT-sided chest pain, dizziness and weakness onset
today.

EXAM:
CHEST - 2 VIEW

[w chest pa]
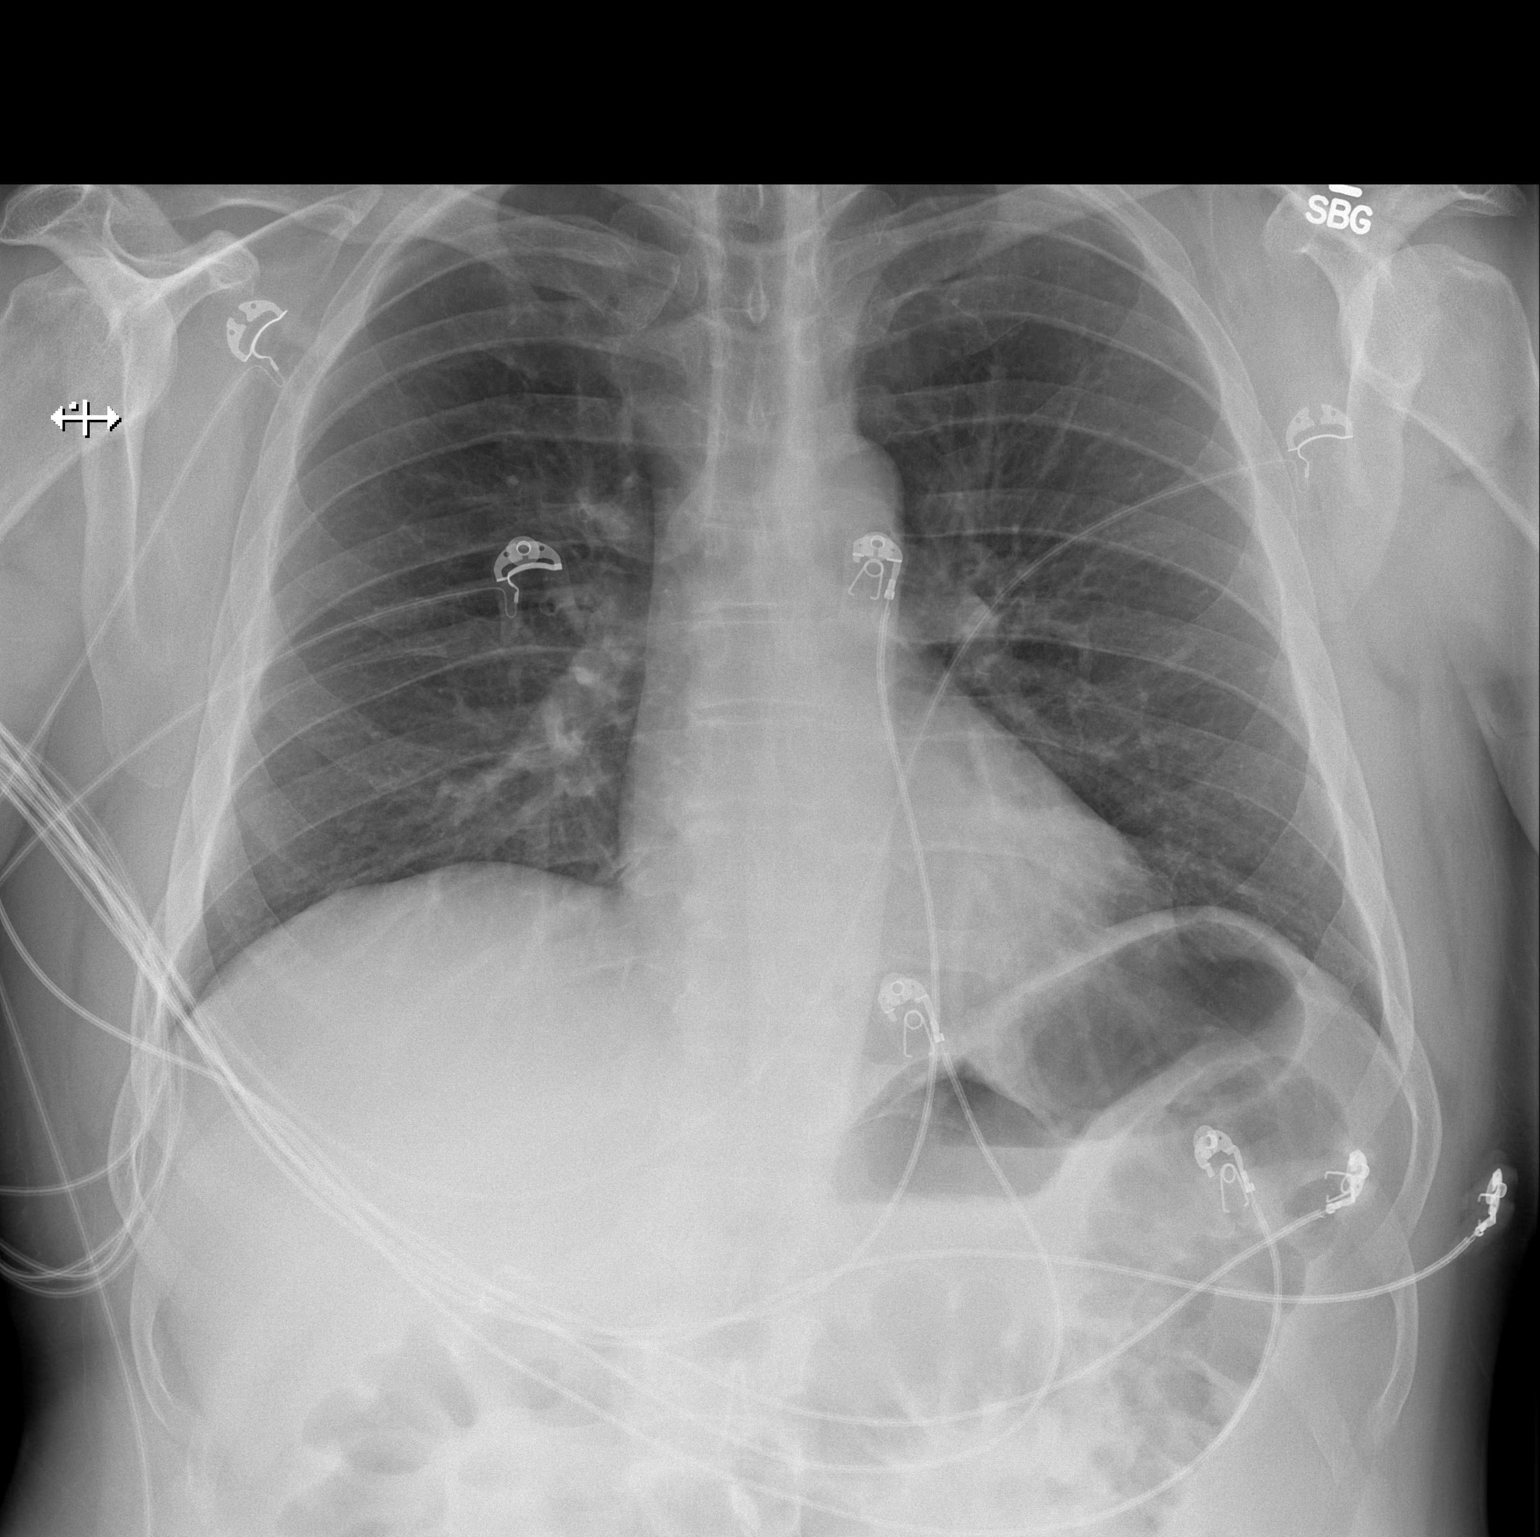

[w chest lat]
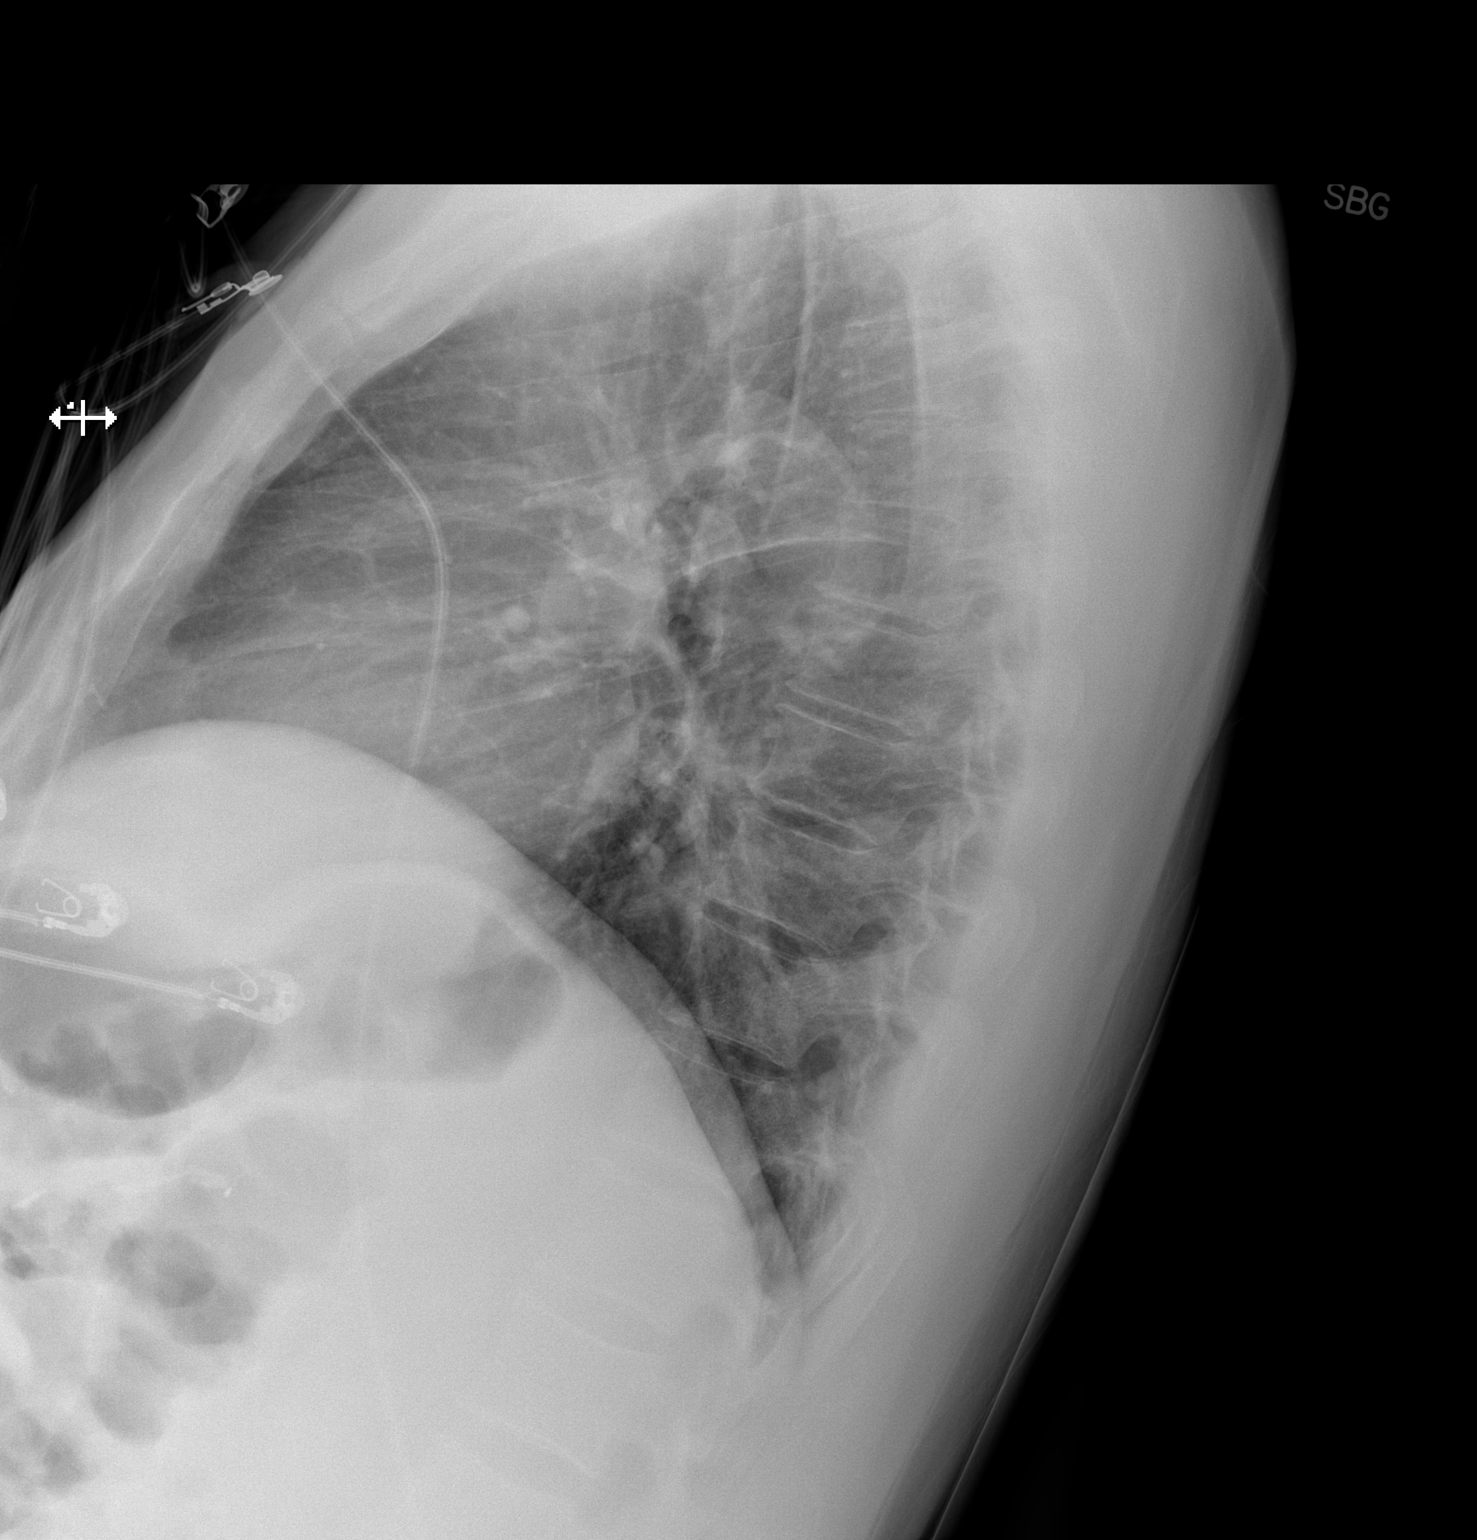

[2 of 2 positions shown; findings below may reference images not displayed]

FINDINGS: Heart size and mediastinal contours are within normal limits. Lungs
are clear. No pleural effusion or pneumothorax seen. No acute or
suspicious osseous finding. Fusion hardware within the lower
cervical spine is incompletely imaged.
IMPRESSION: No active cardiopulmonary disease. No evidence of pneumonia or
pulmonary edema.
